# Patient Record
Sex: Female | Born: 1950 | Race: White | Hispanic: No | Marital: Single | Smoking: Never smoker
Health system: Southern US, Community
[De-identification: ages and names within clinical notes are randomized; demographics above are authoritative.]

## PROBLEM LIST (undated history)

## (undated) DIAGNOSIS — E119 Type 2 diabetes mellitus without complications: Secondary | ICD-10-CM

## (undated) HISTORY — PX: HAND SURGERY: SHX662

## (undated) HISTORY — PX: CHOLECYSTECTOMY: SHX55

## (undated) HISTORY — PX: APPENDECTOMY: SHX54

---

## 2003-04-25 ENCOUNTER — Emergency Department (HOSPITAL_COMMUNITY): Admission: EM | Admit: 2003-04-25 | Discharge: 2003-04-25 | Payer: Self-pay | Admitting: Emergency Medicine

## 2007-11-09 ENCOUNTER — Ambulatory Visit: Payer: Self-pay | Admitting: Internal Medicine

## 2007-12-08 ENCOUNTER — Ambulatory Visit (HOSPITAL_COMMUNITY): Admission: RE | Admit: 2007-12-08 | Discharge: 2007-12-08 | Payer: Self-pay | Admitting: Cardiovascular Disease

## 2009-03-05 DEATH — deceased

## 2010-12-18 NOTE — H&P (Signed)
NAMECAMPBELL, AGRAMONTE NO.:  [id number]   MEDICAL RECORD NO.:  [id number]          PATIENT TYPE:  OIB   LOCATION:  2899                         FACILITY:  [facility]   PHYSICIAN:  Nanetta Batty, M.D.   DATE OF BIRTH:  [birth year]   DATE OF ADMISSION:  12/08/2007  DATE OF DISCHARGE:                              HISTORY & PHYSICAL   CHIEF COMPLAINT:  Chest pain with abnormal Myoview   HISTORY OF PRESENT ILLNESS:  [name] is a 60 year old female who  was referred to [place] by Dr. Harland Dingwall.  She has a history of insulin-  dependent diabetes and hypertension and a family history of premature  coronary disease.  She was seen at the [facility] with some  complaints of chest pain.  She underwent a Myoview on November 24, 2007,  this was abnormal with inferior apical wall defect with a mild peri-  infarct ischemia with an EF of 42%.  The patient continued to have some  substernal chest pain.  It was not exertional.  She described it as  someone sitting on her chest.  It does radiate up into her jaw.  She is  admitted now electively for diagnostic catheterization.   PAST MEDICAL HISTORY:  Remarkable for insulin-dependent diabetes for the  last few years.  She has hypertension.  She has gastroesophageal reflux.  She has had remote left lower extremity DVT after she broke her leg and  was in a cast.  She was put on Coumadin for 6 months.   CURRENT MEDICATIONS:  1. Lantus insulin 35 units subcu daily.  2. Protonix 40 mg a day.  3. Aspirin daily.   ALLERGIES:  She is allergic to SULFA.   SOCIAL HISTORY:  She is married.  She is a nonsmoker.  She is 2 years of  college.   FAMILY HISTORY:  Remarkable that her mother had coronary disease in her  58s.   REVIEW OF SYSTEMS:  Essentially unremarkable except for noted above.  She denies any history of recent fever, chills, or arrhythmia.  She has  not had kidney stones or bladder trouble.   PHYSICAL EXAMINATION:   VITAL SIGNS:  Blood pressure is 158/92, pulse 70,  temperature 96.7, and respirations 12.  GENERAL:  She is a well-developed, morbidly obese female in no acute  distress.  HEENT:  Normocephalic.  Extraocular movements are intact.  Sclera is  nonicteric.  NECK:  Without JVD or bruit.  Thyroid is not enlarged.  CHEST:  Clear to auscultation and percussion.  CARDIAC:  Reveals regular rate and rhythm without murmur or gallop.  Normal S1 and S2.  ABDOMEN:  Obese and nontender.  EXTREMITIES:  Without edema.  Distal pulses are 3+/4.  NEUROLOGIC:  Exam grossly intact.  She is awake, alert, oriented, and  cooperative.  Moves all extremities without obvious deficit.   EKG shows sinus rhythm without acute changes.   IMPRESSION:  1. Unstable angina.  2. Abnormal Myoview.  3. Insulin-dependent diabetes.  4. Hypertension.  5. Family history of coronary disease.  6. Morbid obesity.  7. Remote history of left  lower extremity deep venous thrombosis,      treated for 6 months with Coumadin after a leg fracture.   PLAN:  The patient is to be admitted for elective catheterization today.      Abelino Derrick, P.A.      Nanetta Batty, M.D.  Electronically Signed    LKK/MEDQ  D:  12/08/2007  T:  12/09/2007  Job:  [id number]

## 2010-12-18 NOTE — Assessment & Plan Note (Signed)
[facility]                         GASTROENTEROLOGY OFFICE NOTE   NAME:Kaitlyn Gentry, Kaitlyn Gentry                   MRN:          [id number]  DATE:11/09/2007                            DOB:          Nov 03, 1950    Self-referred.   CHIEF COMPLAINT:  Indigestion, chest pain, stomach issues.   HISTORY:  This is a lady with a diagnosis of gastroesophageal reflux  disease that was on Nexium with decent control of her symptoms over the  years.  She had an upper GI endoscopy elsewhere, years ago.  She is now  taking up to two to three Nexium a day, due to daily burning pain.  She  has had a couple of severe spells of this.  It does not seem to be  exertional.  It has lasted from 5 to 15 minutes at times.  These  episodes of more severe epigastric and lower chest pain have been  accompanied by nausea and vomiting at times.  She has gained weight.  There is some mild caffeine use at times, not consistent.  She has a lot  of gas, bloating and constipation.  There is some pill dysphagia, but no  food dysphagia.   It is fairly clear that Nexium used to control things and then she  started having more of these symptoms, including these spells.   MEDICATIONS:  Insulin daily, metformin daily, Nexium one to three times  a day 40 mg.   DRUG ALLERGIES:  SULFA causes nausea and vomiting.   PAST MEDICAL HISTORY:  Gastric reflux disease, diabetes mellitus.   PAST SURGICAL HISTORY:  Prior cholecystectomy.   FAMILY HISTORY:  Mother died from colon cancer at age 28 and also had  diabetes.   SOCIAL HISTORY:  The patient is married, two daughters, associate's  degree.  She is a Control and instrumentation engineer.  Occasional alcohol, no tobacco  or drugs.   REVIEW OF SYSTEMS:  Blood sugars have been greater than 200.  She has  not seen primary care in a year or more.  I think the doctor she was  seeing moved.  She has had some pedal edema, itching, difficulty with  vision, insomnia,  excessive thirst and dyspnea.  She is menopausal.   All other review of systems negative.   PHYSICAL EXAM:  Height 5 feet 8, weight 318 pounds, blood pressure  136/100, pulse 84.  She is obese, but in no acute distress.  EYES:  Anicteric.  HEENT:  Normal mouth, posterior oropharynx.  NECK:  Supple, no thyromegaly or mass.  CHEST:  Clear, resonant.  HEART:  S1, S2, no murmurs or gallops.  ABDOMEN:  Soft, nontender, without organomegaly or mass.  LYMPHATIC:  No neck or supraclavicular nodes palpated.  SKIN:  Warm, dry, no acute rash.  NEUROLOGIC:  Cranial nerves II through XII intact.  PSYCHIATRIC:  She is alert and oriented times three.  Appropriate mood  and affect.   ASSESSMENT:  1. I know she has gastroesophageal reflux disease from the history.  2. The chest pain is new and could represent tachyphylaxis to Nexium,      but there are some  features concerning for possible cardiac      disease, and she has diabetes mellitus and obesity, which are      clearly risk factors.  3. She has been noncompliant with followup on her diabetes.  4. There is a family history of colon cancer.   RECOMMENDATIONS AND PLAN:  1. We will try pantoprazole 40 mg daily, since the Nexium does not      seem to be working.  2. I told her before any other studies would be done on my part, she      needs to go back to her primary care office (she can still go to      the office where she was seen) within the next week to be evaluated      regarding the chest pain and possible cardiac causes.  She was      told, if she had persistent spells that did not remit, to call 911      for an ambulance.  3. After she has done that, she can call me back for a followup and,      depending on what has occurred, she might need an EGD, she should      have a colonoscopy.  Though that has been done, it has been more      than five years ago, and the family history of colon cancer      warrants that.     Iva Boop, MD,FACG  Electronically Signed    CEG/MedQ  DD: 11/11/2007  DT: 11/11/2007  Job #: [id number]

## 2010-12-18 NOTE — Cardiovascular Report (Signed)
NAMEMarland Kitchen  Kaitlyn Gentry, Kaitlyn Gentry NO.:  [id number]   MEDICAL RECORD NO.:  [id number]          PATIENT TYPE:  OIB   LOCATION:  2899                         FACILITY:  [facility]   PHYSICIAN:  Nanetta Batty, M.D.   DATE OF BIRTH:  01-14-1951   DATE OF PROCEDURE:  DATE OF DISCHARGE:                            CARDIAC CATHETERIZATION   HISTORY OF PRESENT ILLNESS:  [name] is a 60 year old married white  female with positive risk factors, 53-month history of chest pain  consistent with angina with a positive Myoview for inferior infarct with  peri-infarct ischemia.  She was referred to the courtesy of Arnette Felts,  PA-C in [facility] for diagnostic coronary arteriography to define her  anatomy and rule out ischemic etiology.   PROCEDURE DESCRIPTION:  The patient was brought to the second floor  [place] coronary cath lab in the postabsorptive state.  She was  premedicated with p.o. Valium, IV Versed, and fentanyl.  Her right groin  was prepped and shaved in the usual sterile fashion.  A 1% Xylocaine was  used for local anesthesia.  A 6-French sheath was inserted into the  right femoral artery using standard Seldinger technique.  A 6-French  right and left Judkins diagnostic catheters along with a 6-French  pigtail catheter were used for selective coronary angiography, left  ventriculography respectively.  Visipaque dye was used for the entirety  of the case.  Retrograde aortic, ventricular and pull-back pressures  were recorded.   HEMODYNAMICS:  1. Aortic systolic pressure 177, diastolic pressure 83.  2. Left ventricular systolic pressure 194, end-diastolic pressure 13.      There was no visible pullback gradient noted.   SELECTIVE CHOLANGIOGRAPHY:  1. Left main normal.  2. LAD is normal.  3. Left circumflex normal.  4. Right coronary artery was dominant normal.  5. Left ventriculography; RAO left ventriculogram was performed using      25 mL of Visipaque dye at 12 mL per  second.  The overall  LVEF was      estimated at approximately 55%-60% without focal wall motion      abnormalities.   IMPRESSION:  [name] has essentially normal coronary arteries,  normal left ventricular function.  I believe her Myoview was false  positive and her symptoms are noncardiac.  Empiric antireflux therapy  should be  recommended and gastrointestinal evaluation pursued.  I have discussed  her findings with Arnette Felts, PA-C.  The patient will be discharged from  the hospital later today after remaining recumbent for 5 hours.  I will  see Arnette Felts back in the office in followup.  She left the lab in  stable condition.      Nanetta Batty, M.D.  Electronically Signed     JB/MEDQ  D:  12/08/2007  T:  12/09/2007  Job:  [id number]   cc:   Roxanne Mins, PA-C  [facility]

## 2011-03-06 DEATH — deceased

## 2013-08-21 ENCOUNTER — Encounter (HOSPITAL_COMMUNITY): Payer: Self-pay | Admitting: Emergency Medicine

## 2013-08-21 ENCOUNTER — Emergency Department (HOSPITAL_COMMUNITY)
Admission: EM | Admit: 2013-08-21 | Discharge: 2013-08-22 | Disposition: A | Discharge status: Home / Self Care | Payer: BC Managed Care – PPO | Attending: Emergency Medicine | Admitting: Emergency Medicine

## 2013-08-21 ENCOUNTER — Emergency Department (HOSPITAL_COMMUNITY): Payer: BC Managed Care – PPO

## 2013-08-21 DIAGNOSIS — S99929A Unspecified injury of unspecified foot, initial encounter: Principal | ICD-10-CM

## 2013-08-21 DIAGNOSIS — Z79899 Other long term (current) drug therapy: Secondary | ICD-10-CM | POA: Insufficient documentation

## 2013-08-21 DIAGNOSIS — X500XXA Overexertion from strenuous movement or load, initial encounter: Secondary | ICD-10-CM | POA: Insufficient documentation

## 2013-08-21 DIAGNOSIS — S99919A Unspecified injury of unspecified ankle, initial encounter: Principal | ICD-10-CM

## 2013-08-21 DIAGNOSIS — S8990XA Unspecified injury of unspecified lower leg, initial encounter: Secondary | ICD-10-CM | POA: Insufficient documentation

## 2013-08-21 DIAGNOSIS — Y939 Activity, unspecified: Secondary | ICD-10-CM | POA: Insufficient documentation

## 2013-08-21 DIAGNOSIS — Z794 Long term (current) use of insulin: Secondary | ICD-10-CM | POA: Insufficient documentation

## 2013-08-21 DIAGNOSIS — S8991XA Unspecified injury of right lower leg, initial encounter: Secondary | ICD-10-CM

## 2013-08-21 DIAGNOSIS — Y929 Unspecified place or not applicable: Secondary | ICD-10-CM | POA: Insufficient documentation

## 2013-08-21 DIAGNOSIS — E119 Type 2 diabetes mellitus without complications: Secondary | ICD-10-CM | POA: Insufficient documentation

## 2013-08-21 HISTORY — DX: Type 2 diabetes mellitus without complications: E11.9

## 2013-08-21 MED ORDER — HYDROMORPHONE HCL PF 1 MG/ML IJ SOLN
1.0000 mg | Freq: Once | INTRAMUSCULAR | Status: AC
  Administered 2013-08-22: 1 mg via INTRAMUSCULAR
  Filled 2013-08-21: qty 1

## 2013-08-21 NOTE — ED Notes (Signed)
Pt c/o right knee pain. Pt states she thinks it has popped out of place. Pt states she can not get out of wheelchair.

## 2013-08-21 NOTE — ED Provider Notes (Signed)
CSN: [id number]     Arrival date & time 08/21/13  2335 History  This chart was scribed for Sunnie NielsenBrian Roan Miklos, MD by Bennett Scrapehristina Taylor, ED Scribe. This patient was seen in room APA06/APA06 and the patient's care was started at 11:48 PM.   Chief Complaint  Patient presents with  . Knee Pain   Patient is a 63 y.o. female presenting with knee pain. The history is provided by the patient. No language interpreter was used.  Knee Pain Location:  Knee Time since incident:  2 hours Knee location:  R knee Pain details:    Quality:  Sharp   Severity:  Severe   Onset quality:  Sudden   Timing:  Constant   Progression:  Unchanged Chronicity:  New Worsened by:  Extension Ineffective treatments:  Immobilization (knee held in flexed position )   HPI Comments: Kaitlyn Gentry is a 63 y.o. female who presents to the Emergency Department complaining of sudden onset of severe right knee pain that started after she crossed her right leg over her left with her right ankle resting on the left knee while sitting on her couch approximately 2 hours ago. She reports that she believes that she saw her patella out of place at the onset of the place. Pt states that she has been unable to ambulate or extend her leg since the incident. The pain is worsened with movement of her toes and she reports that she is unable to move the knee out of the flexed position secondary to pain. She denies any weakness or numbness to her left foot or toes.   Past Medical History  Diagnosis Date  . Diabetes mellitus without complication    Past Surgical History  Procedure Laterality Date  . Cholecystectomy    . Appendectomy    . Hand surgery     History reviewed. No pertinent family history. History  Substance Use Topics  . Smoking status: Never Smoker   . Smokeless tobacco: Not on file  . Alcohol Use: Yes     Comment: occasional   No OB history provided.  Review of Systems  Musculoskeletal: Positive for arthralgias (right  knee pain). Negative for joint swelling.  Neurological: Negative for weakness and numbness.  All other systems reviewed and are negative.    Allergies  Review of patient's allergies indicates no known allergies.  Home Medications   Current Outpatient Rx  Name  Route  Sig  Dispense  Refill  . esomeprazole (NEXIUM) 40 MG capsule   Oral   Take 40 mg by mouth daily at 12 noon.         . insulin glargine (LANTUS) 100 UNIT/ML injection   Subcutaneous   Inject 56 Units into the skin at bedtime.           Triage Vitals: BP 157/78  Pulse 82  Temp(Src) 97.9 F (36.6 C) (Oral)  Resp 20  Ht 5\' 8"  (1.727 m)  Wt 173 lb (78.472 kg)  BMI 26.31 kg/m2  SpO2 99%  Physical Exam  Nursing note and vitals reviewed. Constitutional: She is oriented to person, place, and time. She appears well-developed and well-nourished. No distress.  HENT:  Head: Normocephalic and atraumatic.  Eyes: EOM are normal.  Neck: Neck supple. No tracheal deviation present.  Cardiovascular: Normal rate.   Pulmonary/Chest: Effort normal. No respiratory distress.  Musculoskeletal:  Pt is sitting down with her right knee flexed. There is tenderness over the anterior and lateral aspect as well as the patella. No  obvious deformity. No obvious swelling. There is symmetric edema throughout the lower extremities somewhat limiting the exam. She is unable to extend the right knee due to pain. Distal motor and sensation are intact with equal pulses.   Neurological: She is alert and oriented to person, place, and time.  Skin: Skin is warm and dry.  Psychiatric: She has a normal mood and affect. Her behavior is normal.    ED Course  Procedures (including critical care time)  Medications  HYDROmorphone (DILAUDID) injection 1 mg (not administered)   DIAGNOSTIC STUDIES: Oxygen Saturation is 99% on RA, normal by my interpretation.    COORDINATION OF CARE: 11:49 PM-Discussed treatment plan which includes pain  management, x-rays and ice with pt at bedside and pt agreed to plan. Pt also requested to have CBG checked.  Labs Review Labs Reviewed - No data to display Imaging Review Dg Knee Complete 4 Views Right  08/22/2013   CLINICAL DATA:  Right knee pain/swelling  EXAM: RIGHT KNEE - COMPLETE 4+ VIEW  COMPARISON:  None.  FINDINGS: No fracture or dislocation is seen.  Mild degenerative changes involving the medial and patellofemoral compartments.  Mild prepatellar soft tissue swelling.  No suprapatellar knee joint effusion.  IMPRESSION: No fracture or dislocation is seen.  Mild degenerative changes.   Electronically Signed   By: Charline Bills M.D.   On: 08/22/2013 01:37    EKG Interpretation   None      IM Dilaudid provided. Patient developed some nausea and vomiting and Zofran was provided.  2:28 AM on recheck her pain is completely resolved. She is able to flex and extend at the knee. X-ray results reviewed as above.  Clinically concerning for patellar dislocation self reduced prior to arrival.  Knee immobilizer and crutches provided  Plan discharge home with rest ice elevation and knee immobilizer/crutches.  Prescription for Zofran and hydrocodone as needed. Orthopedic referral provided. Patient will call on Monday to schedule followup in the office. Return precautions provided and verbalized is understood.  MDM  Diagnosis: Right knee injury, possible patella dislocation  Evaluated with x-rays reviewed as above. Treated with IM narcotics - pain improved.  Knee immobilizer/ crutches provided with instructions Serial evaluations performed with condition improved. Vital signs nurse's notes reviewed    I personally performed the services described in this documentation, which was scribed in my presence. The recorded information has been reviewed and is accurate.     Sunnie Nielsen, MD 08/22/13 [phone]

## 2013-08-22 LAB — GLUCOSE, CAPILLARY: Glucose-Capillary: 166 mg/dL — ABNORMAL HIGH (ref 70–99)

## 2013-08-22 MED ORDER — ONDANSETRON 4 MG PO TBDP
ORAL_TABLET | ORAL | Status: AC
  Filled 2013-08-22: qty 1

## 2013-08-22 MED ORDER — ONDANSETRON HCL 4 MG PO TABS: 4.0000 mg | ORAL_TABLET | Freq: Four times a day (QID) | ORAL | Status: AC

## 2013-08-22 MED ORDER — ONDANSETRON 4 MG PO TBDP
4.0000 mg | ORAL_TABLET | Freq: Once | ORAL | Status: AC
  Administered 2013-08-22: 4 mg via ORAL

## 2013-08-22 MED ORDER — HYDROCODONE-ACETAMINOPHEN 5-325 MG PO TABS: 1.0000 | ORAL_TABLET | Freq: Four times a day (QID) | ORAL | Status: AC | PRN

## 2013-08-22 NOTE — Discharge Instructions (Signed)
Patellar Dislocation  A patellar dislocation occurs when your kneecap (patella) slips out of its normal position in a groove in front of the lower end of your thighbone (femur). This groove is called the patellofemoral groove.   CAUSES  The kneecap is normally positioned over the front of the knee joint at the base of the thighbone. A kneecap can be dislocated when:  · The kneecap is out of place (patellar tracking disorder), and force is applied.  · The foot is firmly planted pointing outward, and the knee bends with the thigh turned inward. This kind of injury is common during many sports activities.  · The inner edge of the kneecap is hit, pushing it toward the outer side of the leg.  SIGNS AND SYMPTOMS  · Severe pain.  · A misshapen knee that looks like a bone is out of position.  · A popping sensation, followed by a feeling that something is out of place.  · Inability to bend or straighten the knee.  · Knee swelling.  · Cool, pale skin or numbness and tingling in or below the affected knee.  DIAGNOSIS   Your health care provider will physically examine the injured area. An X-ray exam may be done to make sure a bone fracture has not occurred. In some cases, your health care provider may look inside your knee joint with an instrument much like a pencil-sized telescope (arthroscope). This may be done to make sure you have no loose cartilage in your joint. Loose cartilage is not visible on an X-ray image.  TREATMENT   In many instances, the patella can be guided back into position without much difficulty. It often goes back into position by straightening the leg. Often, nothing more may be needed other than a brief period of immobilization followed by the exercises your health care provider recommends. If patellar dislocation starts to become frequent after the first incident, surgery may be needed to prevent your patella from slipping out of place.  HOME CARE INSTRUCTIONS   · Only take over-the-counter or  prescription medicines for pain, discomfort, or fever as directed by your health care provider.  · Use a knee brace if directed to do so by your health care provider.  · Use crutches as instructed.  · Apply ice to the injured knee:  · Put ice in a plastic bag.  · Place a towel between your skin and the bag.  · Leave the ice on for 20 minutes, 2 3 times a day.  · Follow your health care provider's instructions for doing any recommended range-of-motion exercises or other exercises.  SEEK IMMEDIATE MEDICAL CARE IF:  · You have increased pain or swelling in the knee that is not relieved with medicine.  · You have increasing inflammation in the knee.  · You have locking or catching of your knee.  MAKE SURE YOU:  · Understand these instructions.  · Will watch your condition.  · Will get help right away if you are not doing well or get worse.  Document Released: 04/16/2001 Document Revised: 05/12/2013 Document Reviewed: 03/03/2013  [employer]® Patient Information ©2014 [employer].

## 2014-09-13 IMAGING — CR DG KNEE COMPLETE 4+V*R*
4 series · 4 of 4 positions shown · non-contrast
Comparison: None.

CLINICAL DATA: Right knee pain/swelling

EXAM:
RIGHT KNEE - COMPLETE 4+ VIEW

[view not recorded (1 of 4)]
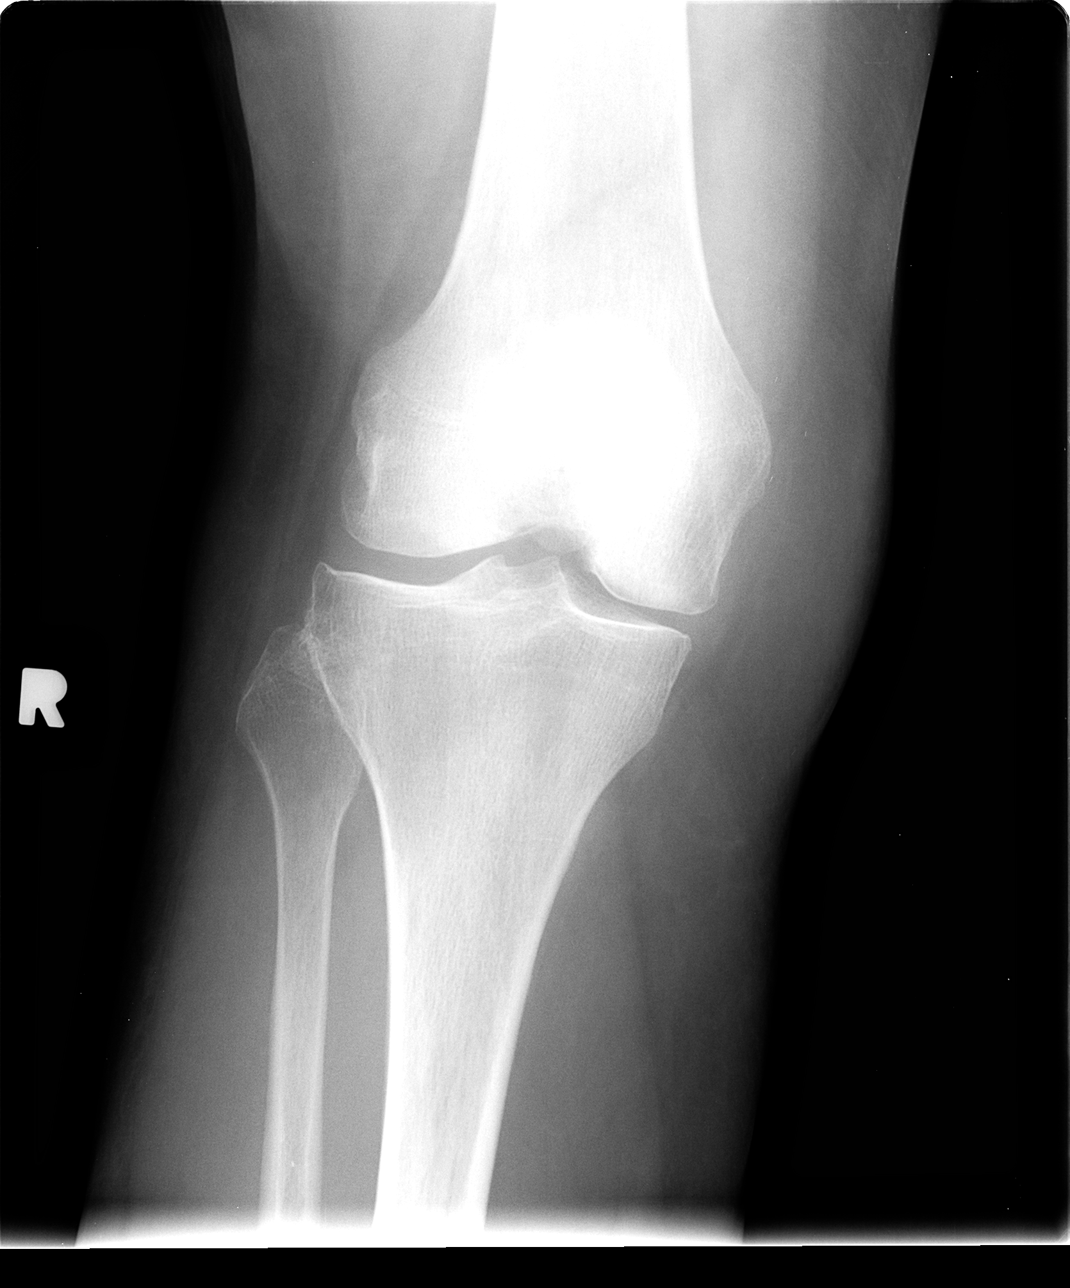

[view not recorded (2 of 4)]
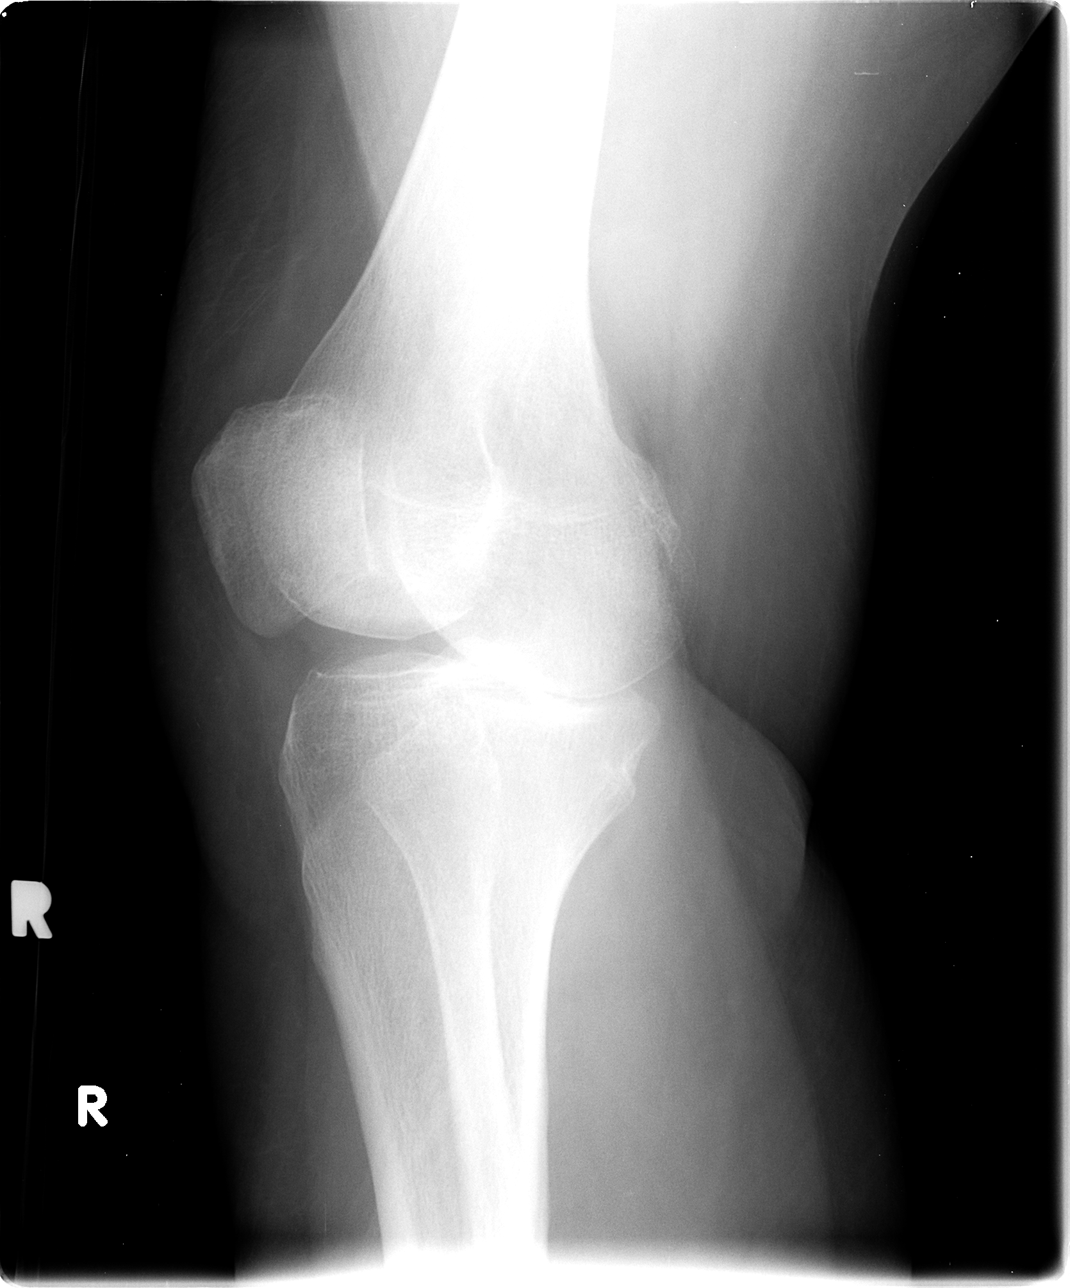

[view not recorded (3 of 4)]
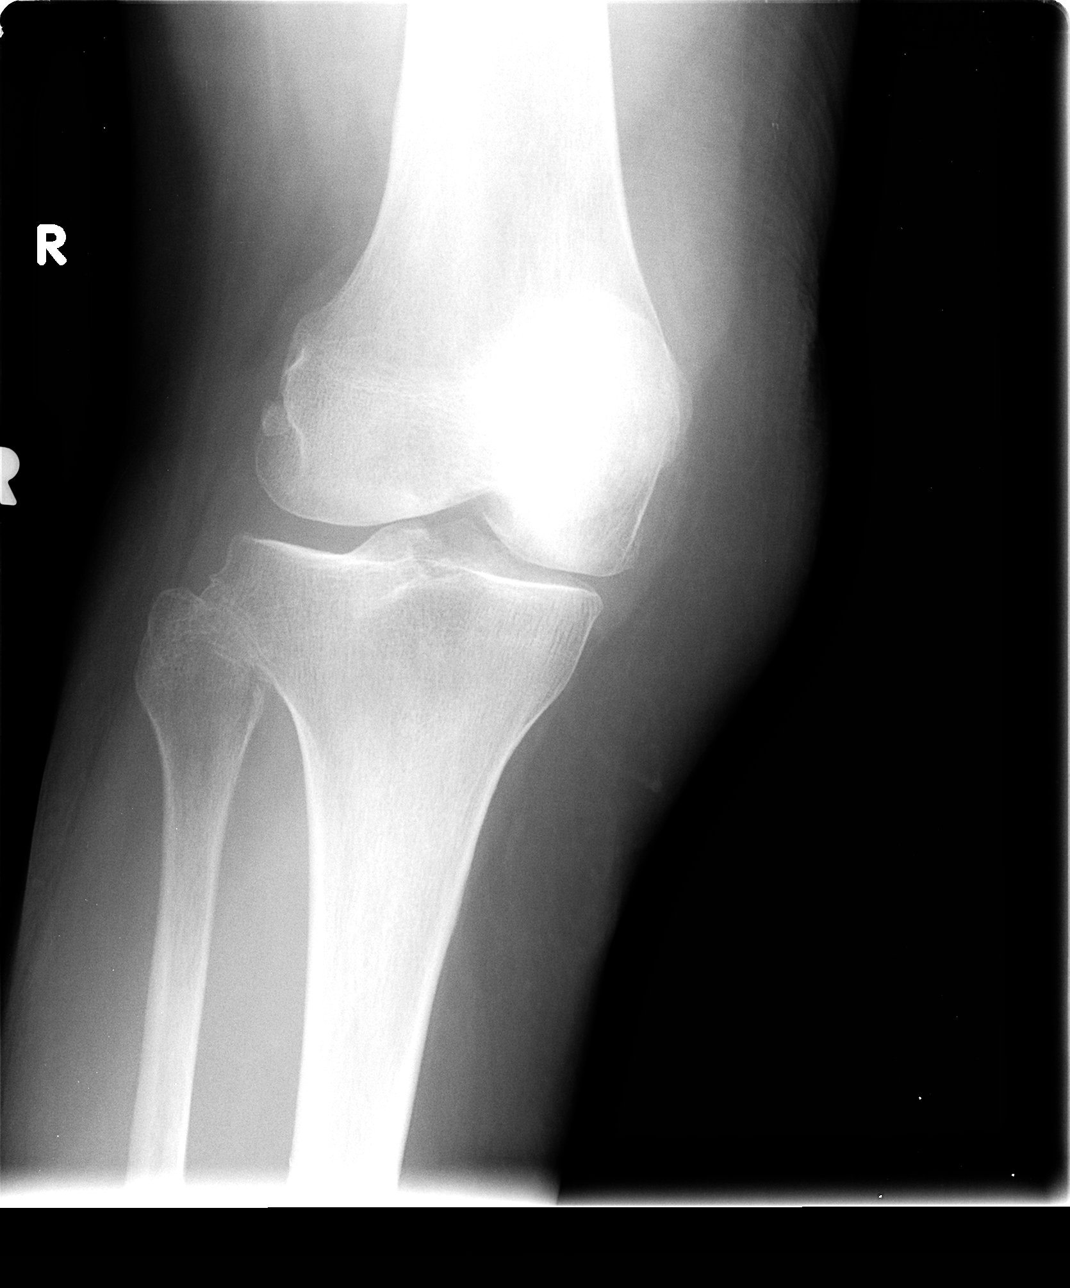

[view not recorded (4 of 4)]
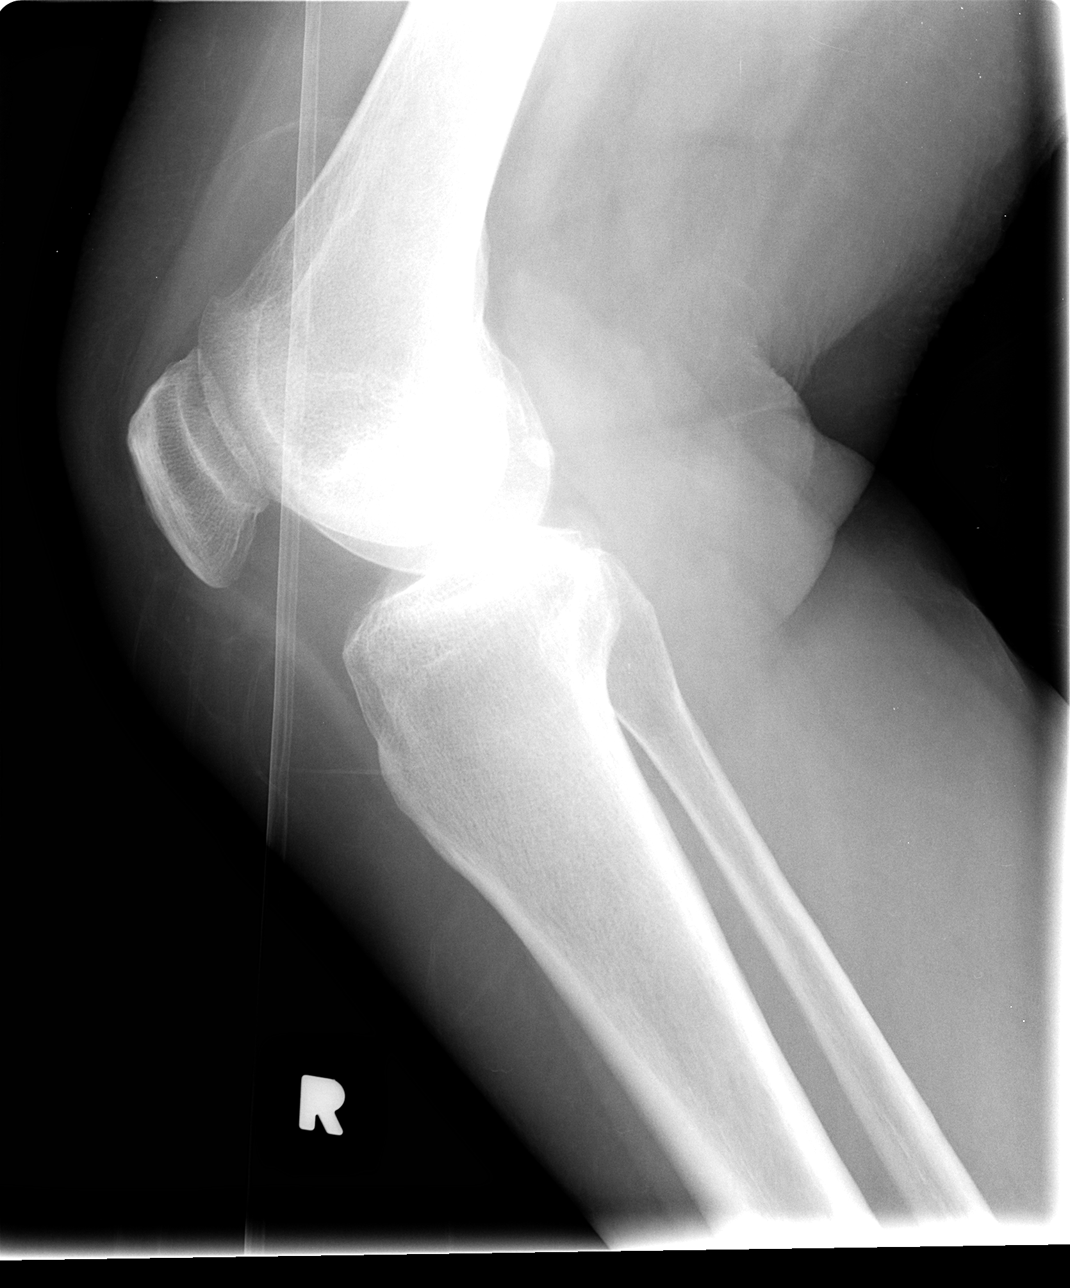

[4 of 4 positions shown; findings below may reference images not displayed]

FINDINGS: No fracture or dislocation is seen.

Mild degenerative changes involving the medial and patellofemoral
compartments.

Mild prepatellar soft tissue swelling.

No suprapatellar knee joint effusion.
IMPRESSION: No fracture or dislocation is seen.

Mild degenerative changes.
